# Patient Record
Sex: Female | Born: 1940 | Race: Black or African American | Hispanic: No | Marital: Married | Smoking: Former smoker
Health system: Southern US, Community
[De-identification: ages and names within clinical notes are randomized; demographics above are authoritative.]

## PROBLEM LIST (undated history)

## (undated) DIAGNOSIS — I10 Essential (primary) hypertension: Secondary | ICD-10-CM

## (undated) DIAGNOSIS — E119 Type 2 diabetes mellitus without complications: Secondary | ICD-10-CM

---

## 1999-09-01 HISTORY — PX: REDUCTION MAMMAPLASTY: SUR839

## 2016-01-30 ENCOUNTER — Other Ambulatory Visit: Payer: Self-pay | Admitting: Adult Health

## 2016-01-30 DIAGNOSIS — Z1231 Encounter for screening mammogram for malignant neoplasm of breast: Secondary | ICD-10-CM

## 2016-02-12 ENCOUNTER — Ambulatory Visit
Admission: RE | Admit: 2016-02-12 | Discharge: 2016-02-12 | Disposition: A | Discharge status: Home / Self Care | Payer: Medicare Other | Source: Ambulatory Visit | Attending: Adult Health | Admitting: Adult Health

## 2016-02-12 ENCOUNTER — Other Ambulatory Visit: Payer: Self-pay | Admitting: Adult Health

## 2016-02-12 DIAGNOSIS — Z1231 Encounter for screening mammogram for malignant neoplasm of breast: Secondary | ICD-10-CM | POA: Insufficient documentation

## 2016-04-09 ENCOUNTER — Encounter: Payer: Self-pay | Admitting: *Deleted

## 2016-04-13 ENCOUNTER — Encounter
Admission: RE | Disposition: A | Discharge status: Home / Self Care | Payer: Self-pay | Source: Ambulatory Visit | Attending: Unknown Physician Specialty

## 2016-04-13 ENCOUNTER — Ambulatory Visit: Payer: Medicare Other | Admitting: Anesthesiology

## 2016-04-13 ENCOUNTER — Encounter: Payer: Self-pay | Admitting: Anesthesiology

## 2016-04-13 ENCOUNTER — Ambulatory Visit
Admission: RE | Admit: 2016-04-13 | Discharge: 2016-04-13 | Disposition: A | Discharge status: Home / Self Care | Payer: Medicare Other | Source: Ambulatory Visit | Attending: Unknown Physician Specialty | Admitting: Unknown Physician Specialty

## 2016-04-13 DIAGNOSIS — Z79899 Other long term (current) drug therapy: Secondary | ICD-10-CM | POA: Diagnosis not present

## 2016-04-13 DIAGNOSIS — D125 Benign neoplasm of sigmoid colon: Secondary | ICD-10-CM | POA: Diagnosis not present

## 2016-04-13 DIAGNOSIS — Z87891 Personal history of nicotine dependence: Secondary | ICD-10-CM | POA: Insufficient documentation

## 2016-04-13 DIAGNOSIS — K643 Fourth degree hemorrhoids: Secondary | ICD-10-CM | POA: Diagnosis not present

## 2016-04-13 DIAGNOSIS — Z1211 Encounter for screening for malignant neoplasm of colon: Secondary | ICD-10-CM | POA: Insufficient documentation

## 2016-04-13 DIAGNOSIS — D122 Benign neoplasm of ascending colon: Secondary | ICD-10-CM | POA: Diagnosis not present

## 2016-04-13 DIAGNOSIS — I10 Essential (primary) hypertension: Secondary | ICD-10-CM | POA: Diagnosis not present

## 2016-04-13 HISTORY — PX: COLONOSCOPY: SHX5424

## 2016-04-13 HISTORY — DX: Essential (primary) hypertension: I10

## 2016-04-13 SURGERY — COLONOSCOPY
Anesthesia: General

## 2016-04-13 MED ORDER — PROPOFOL 500 MG/50ML IV EMUL
INTRAVENOUS | Status: DC | PRN
  Administered 2016-04-13: 50 ug/kg/min via INTRAVENOUS

## 2016-04-13 MED ORDER — SODIUM CHLORIDE 0.9 % IV SOLN: INTRAVENOUS | Status: DC

## 2016-04-13 MED ORDER — SODIUM CHLORIDE 0.9 % IV SOLN
INTRAVENOUS | Status: DC
  Administered 2016-04-13: 1000 mL via INTRAVENOUS

## 2016-04-13 MED ORDER — FENTANYL CITRATE (PF) 100 MCG/2ML IJ SOLN: 25.0000 ug | INTRAMUSCULAR | Status: DC | PRN

## 2016-04-13 MED ORDER — MIDAZOLAM HCL 5 MG/5ML IJ SOLN
INTRAMUSCULAR | Status: DC | PRN
  Administered 2016-04-13: 1 mg via INTRAVENOUS

## 2016-04-13 MED ORDER — FENTANYL CITRATE (PF) 100 MCG/2ML IJ SOLN
INTRAMUSCULAR | Status: DC | PRN
  Administered 2016-04-13: 50 ug via INTRAVENOUS

## 2016-04-13 MED ORDER — LIDOCAINE HCL (PF) 2 % IJ SOLN
INTRAMUSCULAR | Status: DC | PRN
  Administered 2016-04-13: 50 mg

## 2016-04-13 MED ORDER — PROPOFOL 10 MG/ML IV BOLUS
INTRAVENOUS | Status: DC | PRN
  Administered 2016-04-13 (×2): 20 mg via INTRAVENOUS
  Administered 2016-04-13: 30 mg via INTRAVENOUS

## 2016-04-13 MED ORDER — ONDANSETRON HCL 4 MG/2ML IJ SOLN: 4.0000 mg | Freq: Once | INTRAMUSCULAR | Status: DC | PRN

## 2016-04-13 NOTE — H&P (Signed)
   Primary Care Physician:  Lavera Guise, PA-C Primary Gastroenterologist:  Dr. Vira Agar  Pre-Procedure History & Physical: HPI:  Sarah Morse is a 75 y.o. female is here for an colonoscopy.   Past Medical History:  Diagnosis Date  . Hypertension     Past Surgical History:  Procedure Laterality Date  . REDUCTION MAMMAPLASTY Bilateral 2001    Prior to Admission medications   Medication Sig Start Date End Date Taking? Authorizing Provider  lisinopril (PRINIVIL,ZESTRIL) 10 MG tablet Take 10 mg by mouth 2 (two) times daily.   Yes Historical Provider, MD    Allergies as of 03/11/2016  . (Not on File)    Family History  Problem Relation Age of Onset  . Breast cancer Neg Hx     Social History   Social History  . Marital status: Married    Spouse name: N/A  . Number of children: N/A  . Years of education: N/A   Occupational History  . Not on file.   Social History Main Topics  . Smoking status: Former Research scientist (life sciences)  . Smokeless tobacco: Never Used  . Alcohol use Yes  . Drug use: No  . Sexual activity: Not on file   Other Topics Concern  . Not on file   Social History Narrative  . No narrative on file    Review of Systems: See HPI, otherwise negative ROS  Physical Exam: BP (!) (P) 118/92   Pulse 64   Temp 97 F (36.1 C) (Tympanic)   Resp 12   Ht 5' (1.524 m)   Wt 58.5 kg (129 lb)   SpO2 100%   BMI 25.19 kg/m  General:   Alert,  pleasant and cooperative in NAD Head:  Normocephalic and atraumatic. Neck:  Supple; no masses or thyromegaly. Lungs:  Clear throughout to auscultation.    Heart:  Regular rate and rhythm. Abdomen:  Soft, nontender and nondistended. Normal bowel sounds, without guarding, and without rebound.   Neurologic:  Alert and  oriented x4;  grossly normal neurologically.  Impression/Plan: Sarah Morse is here for an colonoscopy to be performed for screening  Risks, benefits, limitations, and alternatives regarding  colonoscopy have  been reviewed with the patient.  Questions have been answered.  All parties agreeable.   Gaylyn Cheers, MD  04/13/2016, 8:30 AM

## 2016-04-13 NOTE — Op Note (Signed)
[facility] Patient Name: Sarah Morse Procedure Date: 04/13/2016 7:40 AM MRN: [id number] Account #: [id number] Date of Birth: Nov 10, 1940 Admit Type: Outpatient Age: 75 Room: [facility] ENDO ROOM 1 Gender: Female Note Status: Finalized Procedure:            Colonoscopy Indications:          Screening for colorectal malignant neoplasm Providers:            Manya Silvas, MD Referring MD:         Lavera Guise, NP Medicines:            Propofol per Anesthesia Procedure:            Pre-Anesthesia Assessment:                       - After reviewing the risks and benefits, the patient                        was deemed in satisfactory condition to undergo the                        procedure.                       After obtaining informed consent, the colonoscope was                        passed under direct vision. Throughout the procedure,                        the patient's blood pressure, pulse, and oxygen                        saturations were monitored continuously. The                        Colonoscope was introduced through the anus and                        advanced to the the cecum, identified by appendiceal                        orifice and ileocecal valve. The colonoscopy was                        performed without difficulty. The patient tolerated the                        procedure well. The quality of the bowel preparation                        was good. Findings:      Two sessile polyps were found in the distal sigmoid colon and mid       ascending colon. The polyps were diminutive in size. These polyps were       removed with a jumbo cold forceps. Resection and retrieval were complete.      External and internal hemorrhoids were found. The hemorrhoids were large       and Grade IV (internal hemorrhoids that prolapse and cannot be reduced       manually). Impression:           -  Two diminutive polyps in the distal sigmoid  colon and                        in the mid ascending colon, removed with a jumbo cold                        forceps. Resected and retrieved. - External and                        internal hemorrhoids. Very Large hemorrhoids. Recommendation:       - Await pathology results. Manya Silvas, MD 04/13/2016 8:15:18 AM This report has been signed electronically. Number of Addenda: 0 Note Initiated On: 04/13/2016 7:40 AM Scope Withdrawal Time: 0 hours 11 minutes 0 seconds  Total Procedure Duration: 0 hours 21 minutes 26 seconds       [facility]

## 2016-04-13 NOTE — Anesthesia Postprocedure Evaluation (Signed)
Anesthesia Post Note  Patient: Sarah Morse  Procedure(s) Performed: Procedure(s) (LRB): COLONOSCOPY (N/A)  Patient location during evaluation: PACU Anesthesia Type: General Level of consciousness: awake and alert and oriented Pain management: pain level controlled Vital Signs Assessment: post-procedure vital signs reviewed and stable Respiratory status: spontaneous breathing Cardiovascular status: blood pressure returned to baseline Anesthetic complications: no    Last Vitals:  Vitals:   04/13/16 0830 04/13/16 0840  BP: 133/78 133/85  Pulse: 62 62  Resp:    Temp:      Last Pain:  Vitals:   04/13/16 0840  TempSrc:   PainSc: 3                  Eduar Kumpf

## 2016-04-13 NOTE — Anesthesia Preprocedure Evaluation (Addendum)
Anesthesia Evaluation  Patient identified by MRN, date of birth, ID band Patient awake    Reviewed: Allergy & Precautions, NPO status , Patient's Chart, lab work & pertinent test results  Airway Mallampati: II       Dental  (+) Partial Lower, Partial Upper   Pulmonary neg pulmonary ROS, former smoker,    Pulmonary exam normal        Cardiovascular hypertension, Pt. on medications Normal cardiovascular exam     Neuro/Psych negative neurological ROS  negative psych ROS   GI/Hepatic negative GI ROS, Neg liver ROS,   Endo/Other  negative endocrine ROS  Renal/GU negative Renal ROS  negative genitourinary   Musculoskeletal negative musculoskeletal ROS (+)   Abdominal Normal abdominal exam  (+)   Peds negative pediatric ROS (+)  Hematology negative hematology ROS (+)   Anesthesia Other Findings   Reproductive/Obstetrics                           Anesthesia Physical Anesthesia Plan  ASA: II  Anesthesia Plan: General   Post-op Pain Management:    Induction: Intravenous  Airway Management Planned: Nasal Cannula  Additional Equipment:   Intra-op Plan:   Post-operative Plan:   Informed Consent: I have reviewed the patients History and Physical, chart, labs and discussed the procedure including the risks, benefits and alternatives for the proposed anesthesia with the patient or authorized representative who has indicated his/her understanding and acceptance.   Dental advisory given  Plan Discussed with: CRNA and Surgeon  Anesthesia Plan Comments:         Anesthesia Quick Evaluation

## 2016-04-13 NOTE — Transfer of Care (Signed)
Immediate Anesthesia Transfer of Care Note  Patient: Sarah Morse  Procedure(s) Performed: Procedure(s): COLONOSCOPY (N/A)  Patient Location: PACU  Anesthesia Type:General  Level of Consciousness: sedated  Airway & Oxygen Therapy: Patient Spontanous Breathing and Patient connected to nasal cannula oxygen  Post-op Assessment: Report given to RN and Post -op Vital signs reviewed and stable  Post vital signs: Reviewed and stable  Last Vitals:  Vitals:   04/13/16 0709  BP: (!) 148/71  Pulse: 79  Resp: 16  Temp: (!) 35.6 C    Last Pain: There were no vitals filed for this visit.       Complications: No apparent anesthesia complications

## 2016-04-14 ENCOUNTER — Encounter: Payer: Self-pay | Admitting: Unknown Physician Specialty

## 2016-04-15 LAB — SURGICAL PATHOLOGY

## 2018-03-16 ENCOUNTER — Other Ambulatory Visit: Payer: Self-pay | Admitting: Family Medicine

## 2018-03-16 DIAGNOSIS — Z1231 Encounter for screening mammogram for malignant neoplasm of breast: Secondary | ICD-10-CM

## 2018-07-10 ENCOUNTER — Emergency Department
Admission: EM | Admit: 2018-07-10 | Discharge: 2018-07-10 | Disposition: A | Discharge status: Home / Self Care | Payer: Medicare Other | Attending: Emergency Medicine | Admitting: Emergency Medicine

## 2018-07-10 ENCOUNTER — Encounter: Payer: Self-pay | Admitting: Emergency Medicine

## 2018-07-10 ENCOUNTER — Other Ambulatory Visit: Payer: Self-pay

## 2018-07-10 DIAGNOSIS — Z79899 Other long term (current) drug therapy: Secondary | ICD-10-CM | POA: Diagnosis not present

## 2018-07-10 DIAGNOSIS — Z87891 Personal history of nicotine dependence: Secondary | ICD-10-CM | POA: Insufficient documentation

## 2018-07-10 DIAGNOSIS — I10 Essential (primary) hypertension: Secondary | ICD-10-CM | POA: Insufficient documentation

## 2018-07-10 NOTE — ED Provider Notes (Signed)
[facility] Emergency Department Provider Note  ____________________________________________   I have reviewed the triage vital signs and the nursing notes. Where available I have reviewed prior notes and, if possible and indicated, outside hospital notes.    HISTORY  Chief Complaint Hypertension    HPI Sarah Morse is a 77 y.o. female, delightful, presents today stating that her blood pressures been elevated today.  She states that she has been under a lot of stress recently because her husband has been sick and usually this is the day she takes to relax and think about herself.  She states she was in her normal state of health checked her blood pressure seemed a little bit high this morning, 150, and she became anxious about that rechecked her blood pressure 5 or 6 times and each time she checked it it went up.  She then became concerned that something was wrong, she did a survey of her body and determined that she had a very slight headache.  She states "a nothing headache" and this made her more anxious and then she came in.  Now, on quite reflection, she feels that this is all because she was anxious about her blood pressure.  She denies any stiff neck fever chills numbness weakness focal deficit.  She states she wants to get out of here so that she does not have to drive home in the pitch black.  She states that she wanted to make sure her blood pressure was not can continue to go up.  Blood pressure this time is in the 150s after she relaxes in the bed for a few minutes, she is very satisfied with this and would prefer not to have any further intervention.  She states her headache is completely gone. She did take her blood pressure medication has not had no recent change in her blood pressure, she does drink coffee every day, she states that she might have had extra salt yesterday which usually she is "sensitive to" and can elevate her blood pressure somewhat.  She  denies any neurologic deficit.  Past Medical History:  Diagnosis Date  . Hypertension     There are no active problems to display for this patient.   Past Surgical History:  Procedure Laterality Date  . COLONOSCOPY N/A 04/13/2016   Procedure: COLONOSCOPY;  Surgeon: Manya Silvas, MD;  Location: [facility] ENDOSCOPY;  Service: Endoscopy;  Laterality: N/A;  . REDUCTION MAMMAPLASTY Bilateral 2001    Prior to Admission medications   Medication Sig Start Date End Date Taking? Authorizing Provider  lisinopril (PRINIVIL,ZESTRIL) 10 MG tablet Take 10 mg by mouth 2 (two) times daily.    [provider]    Allergies Patient has no known allergies.  Family History  Problem Relation Age of Onset  . Breast cancer Neg Hx     Social History Social History   Tobacco Use  . Smoking status: Former Research scientist (life sciences)  . Smokeless tobacco: Never Used  Substance Use Topics  . Alcohol use: Yes  . Drug use: No    Review of Systems Constitutional: No fever/chills Eyes: No visual changes. ENT: No sore throat. No stiff neck no neck pain Cardiovascular: Denies chest pain. Respiratory: Denies shortness of breath. Gastrointestinal:   no vomiting.  No diarrhea.  No constipation. Genitourinary: Negative for dysuria. Musculoskeletal: Negative lower extremity swelling Skin: Negative for rash. Neurological: Negative for severe headaches, focal weakness or numbness.   ____________________________________________   PHYSICAL EXAM:  VITAL SIGNS: ED Triage Vitals  Enc Vitals Group     BP 07/10/18 1808 (!) 180/71     Pulse Rate 07/10/18 1808 75     Resp 07/10/18 1808 16     Temp 07/10/18 1808 98.4 F (36.9 C)     Temp Source 07/10/18 1808 Oral     SpO2 07/10/18 1808 100 %     Weight 07/10/18 1809 125 lb (56.7 kg)     Height --      Head Circumference --      Peak Flow --      Pain Score 07/10/18 1809 0     Pain Loc --      Pain Edu? --      Excl. in [place]? --     Constitutional: Alert  and oriented. Well appearing and in no acute distress. Eyes: Conjunctivae are normal Head: Atraumatic HEENT: No congestion/rhinnorhea. Mucous membranes are moist.  Oropharynx non-erythematous Neck:   Nontender with no meningismus, no masses, no stridor Cardiovascular: Normal rate, regular rhythm. Grossly normal heart sounds.  Good peripheral circulation. Respiratory: Normal respiratory effort.  No retractions. Lungs CTAB. Abdominal: Soft and nontender. No distention. No guarding no rebound Back:  There is no focal tenderness or step off.  there is no midline tenderness there are no lesions noted. there is no CVA tenderness Musculoskeletal: No lower extremity tenderness, no upper extremity tenderness. No joint effusions, no DVT signs strong distal pulses no edema Neurologic:  cranial nerves II through XII are grossly intact 5 out of 5 strength bilateral upper and lower extremity. Finger to nose within normal limits heel to shin within normal limits, speech is normal with no word finding difficulty or dysarthria, reflexes symmetric, pupils are equally round and reactive to light, there is no pronator drift, sensation is normal, vision is intact to confrontation, gait is deferred, there is no nystagmus, normal neurologic exam.  Skin:  Skin is warm, dry and intact. No rash noted. Psychiatric: Mood and affect are somewhat anxious. Speech and behavior are normal.  ____________________________________________   LABS (all labs ordered are listed, but only abnormal results are displayed)  Labs Reviewed - No data to display  Pertinent labs  results that were available during my care of the patient were reviewed by me and considered in my medical decision making (see chart for details). ____________________________________________  EKG  I personally interpreted any EKGs ordered by me or triage  ____________________________________________  RADIOLOGY  Pertinent labs & imaging results that were  available during my care of the patient were reviewed by me and considered in my medical decision making (see chart for details). If possible, patient and/or family made aware of any abnormal findings.  No results found. ____________________________________________    PROCEDURES  Procedure(s) performed: None  Procedures  Critical Care performed: None  ____________________________________________   INITIAL IMPRESSION / ASSESSMENT AND PLAN / ED COURSE  Pertinent labs & imaging results that were available during my care of the patient were reviewed by me and considered in my medical decision making (see chart for details).  Patient here today because she checked her blood pressure and every time she checked that she became more anxious and every time she became more anxious her blood pressure one father up in the cycle continue to she came to the emergency room.  She is now calm and relaxed and her blood pressure is back down to its baseline she has no complaints, I explained to her that her slight headache could represent some significant pathology but  I have very low suspicion.  I offered her a CT scan of her head but she is adamant that she would like to go home at this time because it is getting dark and she has to drive herself and her husband home she is the primary caretaker of her husband who is poor health.  Accordingly, she declines CT scan which I do not think is unreasonable.  I do not think is any acute indication for imaging blood work or other work-up at this moment given how her exam and her vitals are at this time.  I think patient is capable of making the decision and she understands limitation of the work-up as it is however.  Extensive return precautions follow-up given and understood patient is requesting discharge paperwork at this time we will discharge her, I have advised her not to take her blood pressure 1 twice a day and to call her doctor on Monday.     ____________________________________________   FINAL CLINICAL IMPRESSION(S) / ED DIAGNOSES  Final diagnoses:  None      This chart was dictated using voice recognition software.  Despite best efforts to proofread,  errors can occur which can change meaning.      Schuyler Amor, MD 07/10/18 1945

## 2018-07-10 NOTE — Discharge Instructions (Addendum)
We advised that you check your blood pressure no more than twice a day, you may keep a log of it, and let your doctor know and they may decide to change your blood pressure medications.  You prefer not to stay for CT scan today which is certainly not unreasonable, but it does limit what we can evaluate you for.  We are however reassured by your exam and your blood pressure here and we asked that you return to the emergency room for any new or worrisome symptoms including numbness weakness chest pain severe headache or other concerns.  We do advise that you continue taking her blood pressure medications as prescribed, and again discussed with your blood pressure physician.  It was a pleasure to meet you please drive safely

## 2018-07-10 NOTE — ED Triage Notes (Signed)
Pt to ED via POV c/o HTN. Pt states that she has a hx/o High Blood Pressure, pt states that she takes medication to manage her HBP but today her blood pressure has been up and down. Pt states that she has had slight headache. Pt is in NAD

## 2019-03-16 ENCOUNTER — Other Ambulatory Visit: Payer: Self-pay | Admitting: Student

## 2019-03-16 DIAGNOSIS — R1010 Upper abdominal pain, unspecified: Secondary | ICD-10-CM

## 2019-03-23 ENCOUNTER — Ambulatory Visit
Admission: RE | Admit: 2019-03-23 | Discharge: 2019-03-23 | Disposition: A | Discharge status: Home / Self Care | Payer: Medicare Other | Source: Ambulatory Visit | Attending: Student | Admitting: Student

## 2019-03-23 ENCOUNTER — Other Ambulatory Visit: Payer: Self-pay

## 2019-03-23 DIAGNOSIS — R1010 Upper abdominal pain, unspecified: Secondary | ICD-10-CM | POA: Insufficient documentation

## 2019-03-31 ENCOUNTER — Other Ambulatory Visit: Payer: Self-pay | Admitting: Student

## 2019-03-31 DIAGNOSIS — K5909 Other constipation: Secondary | ICD-10-CM

## 2019-03-31 DIAGNOSIS — R634 Abnormal weight loss: Secondary | ICD-10-CM

## 2019-03-31 DIAGNOSIS — R1084 Generalized abdominal pain: Secondary | ICD-10-CM

## 2019-04-06 ENCOUNTER — Ambulatory Visit
Admission: RE | Admit: 2019-04-06 | Discharge: 2019-04-06 | Disposition: A | Discharge status: Home / Self Care | Payer: Medicare Other | Source: Ambulatory Visit | Attending: Student | Admitting: Student

## 2019-04-06 ENCOUNTER — Other Ambulatory Visit: Payer: Self-pay

## 2019-04-06 DIAGNOSIS — R1084 Generalized abdominal pain: Secondary | ICD-10-CM | POA: Insufficient documentation

## 2019-04-06 DIAGNOSIS — R634 Abnormal weight loss: Secondary | ICD-10-CM | POA: Diagnosis present

## 2019-04-06 DIAGNOSIS — K5909 Other constipation: Secondary | ICD-10-CM | POA: Insufficient documentation

## 2019-04-06 HISTORY — DX: Type 2 diabetes mellitus without complications: E11.9

## 2019-04-06 LAB — POCT I-STAT CREATININE: Creatinine, Ser: 0.8 mg/dL (ref 0.44–1.00)

## 2019-04-06 MED ORDER — IOHEXOL 300 MG/ML  SOLN
75.0000 mL | Freq: Once | INTRAMUSCULAR | Status: AC | PRN
  Administered 2019-04-06: 75 mL via INTRAVENOUS

## 2019-06-15 ENCOUNTER — Other Ambulatory Visit: Payer: Self-pay | Admitting: Family Medicine

## 2019-06-15 DIAGNOSIS — Z1231 Encounter for screening mammogram for malignant neoplasm of breast: Secondary | ICD-10-CM

## 2019-10-05 ENCOUNTER — Ambulatory Visit
Admission: RE | Admit: 2019-10-05 | Discharge: 2019-10-05 | Disposition: A | Discharge status: Home / Self Care | Payer: Medicare Other | Source: Ambulatory Visit | Attending: Family Medicine | Admitting: Family Medicine

## 2019-10-05 DIAGNOSIS — Z1231 Encounter for screening mammogram for malignant neoplasm of breast: Secondary | ICD-10-CM | POA: Insufficient documentation

## 2019-11-30 ENCOUNTER — Ambulatory Visit: Payer: TRICARE For Life (TFL) | Attending: Internal Medicine

## 2019-11-30 DIAGNOSIS — Z23 Encounter for immunization: Secondary | ICD-10-CM

## 2019-11-30 NOTE — Progress Notes (Signed)
   Covid-19 Vaccination Clinic  Name:  Sarah Morse    MRN: [id number] DOB: 09/02/1940  11/30/2019  Sarah Morse was observed post Covid-19 immunization for 15 minutes without incident. She was provided with Vaccine Information Sheet and instruction to access the V-Safe system.   Sarah Morse was instructed to call 911 with any severe reactions post vaccine: Marland Kitchen Difficulty breathing  . Swelling of face and throat  . A fast heartbeat  . A bad rash all over body  . Dizziness and weakness   Immunizations Administered    Name Date Dose VIS Date Route   Pfizer COVID-19 Vaccine 11/30/2019 10:05 AM 0.3 mL 08/11/2019 Intramuscular   Manufacturer: [place]   Lot: [id number]   [place]: [id number]

## 2019-12-26 ENCOUNTER — Ambulatory Visit: Payer: Medicare Other | Attending: Internal Medicine

## 2019-12-26 DIAGNOSIS — Z23 Encounter for immunization: Secondary | ICD-10-CM

## 2019-12-26 NOTE — Progress Notes (Signed)
   Covid-19 Vaccination Clinic  Name:  Sarah Morse    MRN: [id number] DOB: February 11, 1941  12/26/2019  [name] was observed post Covid-19 immunization for 15 minutes without incident. She was provided with Vaccine Information Sheet and instruction to access the V-Safe system.   [name] was instructed to call 911 with any severe reactions post vaccine: Marland Kitchen Difficulty breathing  . Swelling of face and throat  . A fast heartbeat  . A bad rash all over body  . Dizziness and weakness   Immunizations Administered    Name Date Dose VIS Date Route   Pfizer COVID-19 Vaccine 12/26/2019 10:03 AM 0.3 mL 10/25/2018 Intramuscular   Manufacturer: [employer]   Lot: [id number]   [place]: [id number]

## 2020-04-15 ENCOUNTER — Other Ambulatory Visit: Payer: Self-pay

## 2020-04-15 ENCOUNTER — Other Ambulatory Visit: Payer: Medicare Other

## 2020-04-15 DIAGNOSIS — Z20822 Contact with and (suspected) exposure to covid-19: Secondary | ICD-10-CM

## 2020-04-16 LAB — NOVEL CORONAVIRUS, NAA: SARS-CoV-2, NAA: NOT DETECTED

## 2020-04-16 LAB — SARS-COV-2, NAA 2 DAY TAT

## 2020-07-30 ENCOUNTER — Ambulatory Visit: Payer: Self-pay

## 2020-08-10 ENCOUNTER — Ambulatory Visit: Payer: Medicare Other | Attending: Internal Medicine

## 2020-08-10 DIAGNOSIS — Z23 Encounter for immunization: Secondary | ICD-10-CM

## 2020-08-10 NOTE — Progress Notes (Signed)
   Covid-19 Vaccination Clinic  Name:  Sarah Morse    MRN: [id number] DOB: Apr 11, 1941  08/10/2020  [name] was observed post Covid-19 immunization for 15 minutes without incident. She was provided with Vaccine Information Sheet and instruction to access the V-Safe system.   [name] was instructed to call 911 with any severe reactions post vaccine: Marland Kitchen Difficulty breathing  . Swelling of face and throat  . A fast heartbeat  . A bad rash all over body  . Dizziness and weakness   Immunizations Administered    Name Date Dose VIS Date [place] COVID-19 Vaccine 08/10/2020  9:50 AM 0.3 mL 06/19/2020 Intramuscular   Manufacturer: [place]   Lot: [id number]   [place]: [id number]

## 2020-10-23 ENCOUNTER — Other Ambulatory Visit: Payer: Self-pay | Admitting: Family Medicine

## 2020-10-23 DIAGNOSIS — Z1231 Encounter for screening mammogram for malignant neoplasm of breast: Secondary | ICD-10-CM

## 2020-11-07 ENCOUNTER — Ambulatory Visit
Admission: RE | Admit: 2020-11-07 | Discharge: 2020-11-07 | Disposition: A | Discharge status: Home / Self Care | Payer: Medicare Other | Source: Ambulatory Visit | Attending: Family Medicine | Admitting: Family Medicine

## 2020-11-07 ENCOUNTER — Other Ambulatory Visit: Payer: Self-pay

## 2020-11-07 DIAGNOSIS — Z1231 Encounter for screening mammogram for malignant neoplasm of breast: Secondary | ICD-10-CM | POA: Insufficient documentation

## 2021-07-03 ENCOUNTER — Other Ambulatory Visit: Payer: Self-pay

## 2021-07-03 ENCOUNTER — Ambulatory Visit: Payer: Medicare Other | Attending: Internal Medicine

## 2021-07-03 DIAGNOSIS — Z23 Encounter for immunization: Secondary | ICD-10-CM

## 2021-07-03 MED ORDER — PFIZER COVID-19 VAC BIVALENT 30 MCG/0.3ML IM SUSP
INTRAMUSCULAR | 0 refills | Status: AC
  Filled 2021-07-03: qty 0.3, 1d supply, fill #0

## 2021-07-03 NOTE — Progress Notes (Signed)
   Covid-19 Vaccination Clinic  Name:  Sarah Morse    MRN: [id number] DOB: April 10, 1941  07/03/2021  [name] was observed post Covid-19 immunization for 15 minutes without incident. She was provided with Vaccine Information Sheet and instruction to access the V-Safe system.   [name] was instructed to call 911 with any severe reactions post vaccine: Difficulty breathing  Swelling of face and throat  A fast heartbeat  A bad rash all over body  Dizziness and weakness   Immunizations Administered     Name Date Dose VIS Date Route   Pfizer Covid-19 Vaccine Bivalent Booster 07/03/2021 11:20 AM 0.3 mL 04/30/2021 Intramuscular   Manufacturer: [place]   Lot: [id number]   [place]: [phone]

## 2021-10-20 ENCOUNTER — Other Ambulatory Visit: Payer: Self-pay | Admitting: Family Medicine

## 2021-10-20 DIAGNOSIS — Z1231 Encounter for screening mammogram for malignant neoplasm of breast: Secondary | ICD-10-CM

## 2021-10-23 ENCOUNTER — Emergency Department
Admission: EM | Admit: 2021-10-23 | Discharge: 2021-10-23 | Disposition: A | Discharge status: Home / Self Care | Payer: Medicare Other | Attending: Emergency Medicine | Admitting: Emergency Medicine

## 2021-10-23 ENCOUNTER — Emergency Department: Payer: Medicare Other

## 2021-10-23 ENCOUNTER — Other Ambulatory Visit: Payer: Self-pay

## 2021-10-23 DIAGNOSIS — J069 Acute upper respiratory infection, unspecified: Secondary | ICD-10-CM | POA: Insufficient documentation

## 2021-10-23 DIAGNOSIS — R059 Cough, unspecified: Secondary | ICD-10-CM | POA: Diagnosis present

## 2021-10-23 DIAGNOSIS — R0789 Other chest pain: Secondary | ICD-10-CM

## 2021-10-23 DIAGNOSIS — Z79899 Other long term (current) drug therapy: Secondary | ICD-10-CM | POA: Diagnosis not present

## 2021-10-23 DIAGNOSIS — E119 Type 2 diabetes mellitus without complications: Secondary | ICD-10-CM | POA: Insufficient documentation

## 2021-10-23 DIAGNOSIS — Z20822 Contact with and (suspected) exposure to covid-19: Secondary | ICD-10-CM | POA: Insufficient documentation

## 2021-10-23 DIAGNOSIS — I10 Essential (primary) hypertension: Secondary | ICD-10-CM | POA: Diagnosis not present

## 2021-10-23 LAB — BASIC METABOLIC PANEL
Anion gap: 8 (ref 5–15)
BUN: 16 mg/dL (ref 8–23)
CO2: 26 mmol/L (ref 22–32)
Calcium: 9.4 mg/dL (ref 8.9–10.3)
Chloride: 103 mmol/L (ref 98–111)
Creatinine, Ser: 0.78 mg/dL (ref 0.44–1.00)
GFR, Estimated: >60 mL/min (ref >60)
Glucose, Bld: 145 mg/dL — ABNORMAL HIGH (ref 70–99)
Potassium: 3.7 mmol/L (ref 3.5–5.1)
Sodium: 137 mmol/L (ref 135–145)

## 2021-10-23 LAB — CBC
HCT: 36.2 % (ref 36.0–46.0)
Hemoglobin: 11.6 g/dL — ABNORMAL LOW (ref 12.0–15.0)
MCH: 26.5 pg (ref 26.0–34.0)
MCHC: 32 g/dL (ref 30.0–36.0)
MCV: 82.8 fL (ref 80.0–100.0)
Platelets: 210 10*3/uL (ref 150–400)
RBC: 4.37 MIL/uL (ref 3.87–5.11)
RDW: 13.9 % (ref 11.5–15.5)
WBC: 7 10*3/uL (ref 4.0–10.5)
nRBC: 0 % (ref 0.0–0.2)

## 2021-10-23 LAB — RESP PANEL BY RT-PCR (FLU A&B, COVID) ARPGX2
Influenza A by PCR: NEGATIVE
Influenza B by PCR: NEGATIVE
SARS Coronavirus 2 by RT PCR: NEGATIVE

## 2021-10-23 LAB — TROPONIN I (HIGH SENSITIVITY)
Troponin I (High Sensitivity): 6 ng/L (ref <18)
Troponin I (High Sensitivity): 6 ng/L (ref <18)

## 2021-10-23 NOTE — ED Provider Notes (Signed)
[facility] Provider Note    Event Date/Time   First MD Initiated Contact with Patient 10/23/21 [phone]     (approximate)   History   Cough and Chest Pain   HPI  Sarah Morse is a 81 y.o. female with history of hypertension, diabetes who presents to the emergency department with complaints of several days of sore throat, cough, congestion and then started having diffuse chest tightness that has now resolved.  No shortness of breath, nausea, vomiting, diaphoresis or dizziness.  No lower extremity swelling or pain.  No history of CAD, PE or DVT, CHF.  States granddaughter with similar URI symptoms.   History provided by patient.    Past Medical History:  Diagnosis Date   Diabetes mellitus without complication ([place])    Hypertension     Past Surgical History:  Procedure Laterality Date   COLONOSCOPY N/A 04/13/2016   Procedure: COLONOSCOPY;  Surgeon: Manya Silvas, MD;  Location: [place];  Service: Endoscopy;  Laterality: N/A;   REDUCTION MAMMAPLASTY Bilateral 2001    MEDICATIONS:  Prior to Admission medications   Medication Sig Start Date End Date Taking? Authorizing Provider  COVID-19 mRNA bivalent vaccine, Pfizer, (PFIZER COVID-19 VAC BIVALENT) injection Inject into the muscle. 07/03/21   Carlyle Basques, MD  lisinopril (PRINIVIL,ZESTRIL) 10 MG tablet Take 10 mg by mouth 2 (two) times daily.    [provider]    Physical Exam   Triage Vital Signs: ED Triage Vitals  Enc Vitals Group     BP 10/23/21 0136 (!) 177/72     Pulse Rate 10/23/21 0136 87     Resp 10/23/21 0136 18     Temp 10/23/21 0136 99.1 F (37.3 C)     Temp Source 10/23/21 0136 Oral     SpO2 10/23/21 0136 97 %     Weight 10/23/21 0137 150 lb (68 kg)     Height 10/23/21 0137 4\' 11"  (1.499 m)     Head Circumference --      Peak Flow --      Pain Score 10/23/21 0137 4     Pain Loc --      Pain Edu? --      Excl. in [place]? --     Most recent vital  signs: Vitals:   10/23/21 0136 10/23/21 0430  BP: (!) 177/72 129/61  Pulse: 87 71  Resp: 18   Temp: 99.1 F (37.3 C)   SpO2: 97% 100%    CONSTITUTIONAL: Alert and oriented and responds appropriately to questions. Well-appearing; well-nourished HEAD: Normocephalic, atraumatic EYES: Conjunctivae clear, pupils appear equal, sclera nonicteric ENT: normal nose; moist mucous membranes; No pharyngeal erythema or petechiae, no tonsillar hypertrophy or exudate, no uvular deviation, no unilateral swelling in posterior oropharynx, no trismus or drooling, no muffled voice, normal phonation, no stridor, airway patent. NECK: Supple, normal ROM CARD: RRR; S1 and S2 appreciated; no murmurs, no clicks, no rubs, no gallops RESP: Normal chest excursion without splinting or tachypnea; breath sounds clear and equal bilaterally; no wheezes, no rhonchi, no rales, no hypoxia or respiratory distress, speaking full sentences ABD/GI: Normal bowel sounds; non-distended; soft, non-tender, no rebound, no guarding, no peritoneal signs BACK: The back appears normal EXT: Normal ROM in all joints; no deformity noted, no edema; no cyanosis, no calf tenderness or calf swelling SKIN: Normal color for age and race; warm; no rash on exposed skin NEURO: Moves all extremities equally, normal speech PSYCH: The patient's mood and manner are appropriate.  ED Results / Procedures / Treatments   LABS: (all labs ordered are listed, but only abnormal results are displayed) Labs Reviewed  BASIC METABOLIC PANEL - Abnormal; Notable for the following components:      Result Value   Glucose, Bld 145 (*)    All other components within normal limits  CBC - Abnormal; Notable for the following components:   Hemoglobin 11.6 (*)    All other components within normal limits  RESP PANEL BY RT-PCR (FLU A&B, COVID) ARPGX2  TROPONIN I (HIGH SENSITIVITY)  TROPONIN I (HIGH SENSITIVITY)     EKG:  EKG  Interpretation  Date/Time:  Thursday October 23 2021 01:39:24 EST Ventricular Rate:  79 PR Interval:  150 QRS Duration: 76 QT Interval:  360 QTC Calculation: 412 R Axis:   63 Text Interpretation: Normal sinus rhythm Cannot rule out Anterior infarct , age undetermined Abnormal ECG No previous ECGs available Confirmed by Pryor Curia [phone]) on 10/23/2021 6:05:26 AM         RADIOLOGY: My personal review and interpretation of imaging: Chest x-ray clear.  I have personally reviewed all radiology reports.   DG Chest 2 View  Result Date: 10/23/2021 CLINICAL DATA:  Cough x4 days. EXAM: CHEST - 2 VIEW COMPARISON:  None. FINDINGS: The heart size and mediastinal contours are within normal limits. There is mild calcification of the aortic arch. Both lungs are clear. Mild breast soft tissue artifact is seen overlying the bilateral lung bases. The visualized skeletal structures are unremarkable. IMPRESSION: No active cardiopulmonary disease. Electronically Signed   By: Virgina Norfolk M.D.   On: 10/23/2021 02:04     PROCEDURES:  Critical Care performed: No   CRITICAL CARE Performed by: Cyril Mourning Delisia Mcquiston   Total critical care time: 0 minutes  Critical care time was exclusive of separately billable procedures and treating other patients.  Critical care was necessary to treat or prevent imminent or life-threatening deterioration.  Critical care was time spent personally by me on the following activities: development of treatment plan with patient and/or surrogate as well as nursing, discussions with consultants, evaluation of patient's response to treatment, examination of patient, obtaining history from patient or surrogate, ordering and performing treatments and interventions, ordering and review of laboratory studies, ordering and review of radiographic studies, pulse oximetry and re-evaluation of patient's condition.   Marland Kitchen1-3 Lead EKG Interpretation Performed by: Nicolle Heward, Delice Bison,  DO Authorized by: Zaleah Ternes, Delice Bison, DO     Interpretation: normal     ECG rate:  71   ECG rate assessment: normal     Rhythm: sinus rhythm     Ectopy: none     Conduction: normal      IMPRESSION / MDM / ASSESSMENT AND PLAN / ED COURSE  I reviewed the triage vital signs and the nursing notes.    Patient with atypical chest pain with URI symptoms.  The patient is on the cardiac monitor to evaluate for evidence of arrhythmia and/or significant heart rate changes.   DIFFERENTIAL DIAGNOSIS (includes but not limited to):   Viral URI, influenza, COVID-19, pneumonia.  Less likely ACS, PE, dissection, pneumothorax, CHF   PLAN: We will obtain CBC, BMP, troponin x2, EKG, chest x-ray, COVID and flu swabs.  She declines any medication for discomfort at this time.   MEDICATIONS GIVEN IN ED: Medications - No data to display   ED COURSE: Patient has no leukocytosis or leukopenia.  Normal electrolytes and renal function.  Troponin x2 negative.  EKG nonischemic.  COVID  and flu swabs negative.  Chest x-ray reviewed by myself and radiologist and shows no infiltrate, edema or pneumothorax.  She remains hemodynamically stable and reports her chest tightness has resolved.  No events seen on cardiac monitoring.  I recommended over-the-counter Tylenol as needed for fever and pain at home.  I do not feel at this time she needs antibiotics.  No increased work of breathing, respiratory distress.  I feel she is safe for discharge.  At this time, I do not feel there is any life-threatening condition present. I reviewed all nursing notes, vitals, pertinent previous records.  All lab and urine results, EKGs, imaging ordered have been independently reviewed and interpreted by myself.  I reviewed all available radiology reports from any imaging ordered this visit.  Based on my assessment, I feel the patient is safe to be discharged home without further emergent workup and can continue workup as an outpatient as  needed. Discussed all findings, treatment plan as well as usual and customary return precautions with patient.  They verbalize understanding and are comfortable with this plan.  Outpatient follow-up has been provided as needed.  All questions have been answered.    CONSULTS: Admission considered given patient has risk factors for ACS including hypertension, diabetes and age but given her reassuring work-up and that she is now asymptomatic and seems like symptoms are more likely due to a viral URI, I feel she is safe for discharge.   OUTSIDE RECORDS REVIEWED: Reviewed patient's most recent office visit with primary care Dr. Maryland Pink on 09/23/2021.         FINAL CLINICAL IMPRESSION(S) / ED DIAGNOSES   Final diagnoses:  Viral URI with cough  Atypical chest pain     Rx / DC Orders   ED Discharge Orders     None        Note:  This document was prepared using Dragon voice recognition software and may include unintentional dictation errors.   Teira Arcilla, Delice Bison, DO 10/23/21 [phone]

## 2021-10-23 NOTE — ED Triage Notes (Signed)
Pt presents via POV with complaints of a cough for the last 4 days that has progressively gotten worse with an associated sore throat. Pt states that she has "chest spasms" - starting this evening, describing the spasm as a dull pain. Denies SOB.

## 2021-10-23 NOTE — Discharge Instructions (Signed)
You may take over-the-counter Tylenol 1000 mg every 6 hours as needed for pain.  Your cardiac work-up today was reassuring.  Your chest x-ray was clear.  Your COVID and flu swabs were negative.

## 2021-11-27 ENCOUNTER — Ambulatory Visit
Admission: RE | Admit: 2021-11-27 | Discharge: 2021-11-27 | Disposition: A | Discharge status: Home / Self Care | Payer: Medicare Other | Source: Ambulatory Visit | Attending: Family Medicine | Admitting: Family Medicine

## 2021-11-27 DIAGNOSIS — Z1231 Encounter for screening mammogram for malignant neoplasm of breast: Secondary | ICD-10-CM | POA: Insufficient documentation

## 2022-02-12 ENCOUNTER — Other Ambulatory Visit: Payer: Self-pay | Admitting: Sports Medicine

## 2022-02-12 DIAGNOSIS — G8929 Other chronic pain: Secondary | ICD-10-CM

## 2022-02-12 DIAGNOSIS — M5136 Other intervertebral disc degeneration, lumbar region: Secondary | ICD-10-CM

## 2022-03-05 ENCOUNTER — Ambulatory Visit
Admission: RE | Admit: 2022-03-05 | Discharge: 2022-03-05 | Disposition: A | Discharge status: Home / Self Care | Payer: Medicare Other | Source: Ambulatory Visit | Attending: Sports Medicine | Admitting: Sports Medicine

## 2022-03-05 DIAGNOSIS — G8929 Other chronic pain: Secondary | ICD-10-CM | POA: Insufficient documentation

## 2022-03-05 DIAGNOSIS — M5442 Lumbago with sciatica, left side: Secondary | ICD-10-CM | POA: Diagnosis present

## 2022-03-05 DIAGNOSIS — M5441 Lumbago with sciatica, right side: Secondary | ICD-10-CM | POA: Insufficient documentation

## 2022-03-05 DIAGNOSIS — M5136 Other intervertebral disc degeneration, lumbar region: Secondary | ICD-10-CM | POA: Diagnosis present

## 2022-08-04 ENCOUNTER — Ambulatory Visit: Payer: Medicare Other | Admitting: Certified Registered Nurse Anesthetist

## 2022-08-04 ENCOUNTER — Encounter: Payer: Self-pay | Admitting: *Deleted

## 2022-08-04 ENCOUNTER — Encounter
Admission: RE | Disposition: A | Discharge status: Home / Self Care | Payer: Self-pay | Source: Ambulatory Visit | Attending: Gastroenterology

## 2022-08-04 ENCOUNTER — Ambulatory Visit
Admission: RE | Admit: 2022-08-04 | Discharge: 2022-08-04 | Disposition: A | Discharge status: Home / Self Care | Payer: Medicare Other | Source: Ambulatory Visit | Attending: Gastroenterology | Admitting: Gastroenterology

## 2022-08-04 DIAGNOSIS — Z1211 Encounter for screening for malignant neoplasm of colon: Secondary | ICD-10-CM | POA: Insufficient documentation

## 2022-08-04 DIAGNOSIS — Z86010 Personal history of colonic polyps: Secondary | ICD-10-CM | POA: Insufficient documentation

## 2022-08-04 DIAGNOSIS — E119 Type 2 diabetes mellitus without complications: Secondary | ICD-10-CM | POA: Diagnosis not present

## 2022-08-04 DIAGNOSIS — I10 Essential (primary) hypertension: Secondary | ICD-10-CM | POA: Diagnosis not present

## 2022-08-04 DIAGNOSIS — Z87891 Personal history of nicotine dependence: Secondary | ICD-10-CM | POA: Diagnosis not present

## 2022-08-04 DIAGNOSIS — K642 Third degree hemorrhoids: Secondary | ICD-10-CM | POA: Diagnosis not present

## 2022-08-04 DIAGNOSIS — K5730 Diverticulosis of large intestine without perforation or abscess without bleeding: Secondary | ICD-10-CM | POA: Insufficient documentation

## 2022-08-04 HISTORY — PX: COLONOSCOPY WITH PROPOFOL: SHX5780

## 2022-08-04 SURGERY — COLONOSCOPY WITH PROPOFOL
Anesthesia: General

## 2022-08-04 MED ORDER — PROPOFOL 500 MG/50ML IV EMUL
INTRAVENOUS | Status: DC | PRN
  Administered 2022-08-04: 150 ug/kg/min via INTRAVENOUS

## 2022-08-04 MED ORDER — EPHEDRINE SULFATE (PRESSORS) 50 MG/ML IJ SOLN
INTRAMUSCULAR | Status: DC | PRN
  Administered 2022-08-04: 5 mg via INTRAVENOUS

## 2022-08-04 MED ORDER — SODIUM CHLORIDE 0.9 % IV SOLN
INTRAVENOUS | Status: DC
  Administered 2022-08-04: 1000 mL via INTRAVENOUS

## 2022-08-04 MED ORDER — PROPOFOL 10 MG/ML IV BOLUS
INTRAVENOUS | Status: DC | PRN
  Administered 2022-08-04: 50 mg via INTRAVENOUS
  Administered 2022-08-04: 20 mg via INTRAVENOUS

## 2022-08-04 MED ORDER — LIDOCAINE HCL (CARDIAC) PF 100 MG/5ML IV SOSY
PREFILLED_SYRINGE | INTRAVENOUS | Status: DC | PRN
  Administered 2022-08-04: 50 mg via INTRAVENOUS

## 2022-08-04 MED ORDER — EPHEDRINE 5 MG/ML INJ
INTRAVENOUS | Status: AC
  Filled 2022-08-04: qty 5

## 2022-08-04 NOTE — Anesthesia Procedure Notes (Signed)
Date/Time: 08/04/2022 10:07 AM  Performed by: Johnna Acosta, CRNAPre-anesthesia Checklist: Patient identified, Emergency Drugs available, Suction available, Patient being monitored and Timeout performed Patient Re-evaluated:Patient Re-evaluated prior to induction Oxygen Delivery Method: Nasal cannula Preoxygenation: Pre-oxygenation with 100% oxygen Induction Type: IV induction

## 2022-08-04 NOTE — Anesthesia Preprocedure Evaluation (Signed)
Anesthesia Evaluation  Patient identified by MRN, date of birth, ID band Patient awake    Reviewed: Allergy & Precautions, NPO status , Patient's Chart, lab work & pertinent test results  History of Anesthesia Complications Negative for: history of anesthetic complications  Airway Mallampati: II       Dental  (+) Partial Lower, Partial Upper, Dental Advidsory Given   Pulmonary neg pulmonary ROS, former smoker   Pulmonary exam normal        Cardiovascular Exercise Tolerance: Good hypertension, Pt. on medications (-) angina (-) Past MI and (-) Cardiac Stents Normal cardiovascular exam(-) dysrhythmias (-) Valvular Problems/Murmurs     Neuro/Psych negative neurological ROS  negative psych ROS   GI/Hepatic negative GI ROS, Neg liver ROS,,,  Endo/Other  diabetes    Renal/GU negative Renal ROS  negative genitourinary   Musculoskeletal negative musculoskeletal ROS (+)    Abdominal Normal abdominal exam  (+)   Peds negative pediatric ROS (+)  Hematology negative hematology ROS (+)   Anesthesia Other Findings Past Medical History: No date: Diabetes mellitus without complication (HCC) No date: Hypertension   Reproductive/Obstetrics                             Anesthesia Physical Anesthesia Plan  ASA: 2  Anesthesia Plan: General   Post-op Pain Management:    Induction: Intravenous  PONV Risk Score and Plan: 3 and Propofol infusion and TIVA  Airway Management Planned: Nasal Cannula and Natural Airway  Additional Equipment:   Intra-op Plan:   Post-operative Plan:   Informed Consent: I have reviewed the patients History and Physical, chart, labs and discussed the procedure including the risks, benefits and alternatives for the proposed anesthesia with the patient or authorized representative who has indicated his/her understanding and acceptance.     Dental advisory given  Plan  Discussed with: CRNA and Surgeon  Anesthesia Plan Comments:         Anesthesia Quick Evaluation

## 2022-08-04 NOTE — H&P (Signed)
Outpatient short stay form Pre-procedure 08/04/2022  Lesly Rubenstein, MD  Primary Physician: Maryland Pink, MD  Reason for visit:  Surveillance  History of present illness:    81 y/o lady with hypertension here for surveillance colonoscopy. Last colonoscopy 5 years ago with small adenoma. No blood thinners. No significant abdominal surgeries. No family history of GI malignancies.    Current Facility-Administered Medications:    0.9 %  sodium chloride infusion, , Intravenous, Continuous, Sharry Beining, Hilton Cork, MD  Medications Prior to Admission  Medication Sig Dispense Refill Last Dose   lisinopril (PRINIVIL,ZESTRIL) 10 MG tablet Take 10 mg by mouth 2 (two) times daily.   08/04/2022 at 0615   COVID-19 mRNA bivalent vaccine, Pfizer, (PFIZER COVID-19 VAC BIVALENT) injection Inject into the muscle. 0.3 mL 0      No Known Allergies   Past Medical History:  Diagnosis Date   Diabetes mellitus without complication ([place])    Hypertension     Review of systems:  Otherwise negative.    Physical Exam  Gen: Alert, oriented. Appears stated age.  HEENT: PERRLA. Lungs: No respiratory distress CV: RRR Abd: soft, benign, no masses Ext: No edema    Planned procedures: Proceed with colonoscopy. The patient understands the nature of the planned procedure, indications, risks, alternatives and potential complications including but not limited to bleeding, infection, perforation, damage to internal organs and possible oversedation/side effects from anesthesia. The patient agrees and gives consent to proceed.  Please refer to procedure notes for findings, recommendations and patient disposition/instructions.     Lesly Rubenstein, MD [facility]

## 2022-08-04 NOTE — Transfer of Care (Signed)
Immediate Anesthesia Transfer of Care Note  Patient: Sarah Morse  Procedure(s) Performed: COLONOSCOPY WITH PROPOFOL  Patient Location: PACU  Anesthesia Type:General  Level of Consciousness: awake and drowsy  Airway & Oxygen Therapy: Patient Spontanous Breathing  Post-op Assessment: Report given to RN and Post -op Vital signs reviewed and stable  Post vital signs: Reviewed and stable  Last Vitals:  Vitals Value Taken Time  BP 109/40 08/04/22 1032  Temp 35.6 C 08/04/22 1032  Pulse 86 08/04/22 1033  Resp 15 08/04/22 1033  SpO2 100 % 08/04/22 1033  Vitals shown include unvalidated device data.  Last Pain:  Vitals:   08/04/22 1032  TempSrc: Temporal  PainSc: 0-No pain         Complications: No notable events documented.

## 2022-08-04 NOTE — Op Note (Signed)
[facility] Patient Name: Sarah Morse Procedure Date: 08/04/2022 9:55 AM MRN: [id number] Account #: [id number] Date of Birth: 12-10-1940 Admit Type: Outpatient Age: 81 Room: [facility] ENDO ROOM 1 Gender: Female Note Status: Finalized Instrument Name: Peds Colonoscope [id number] Procedure:             Colonoscopy Indications:           Surveillance: Personal history of adenomatous polyps                         on last colonoscopy 5 years ago Providers:             Andrey Farmer MD, MD Referring MD:          Irven Easterly. Kary Kos, MD (Referring MD) Medicines:             Monitored Anesthesia Care Complications:         No immediate complications. Procedure:             Pre-Anesthesia Assessment:                        - Prior to the procedure, a History and Physical was                         performed, and patient medications and allergies were                         reviewed. The patient is competent. The risks and                         benefits of the procedure and the sedation options and                         risks were discussed with the patient. All questions                         were answered and informed consent was obtained.                         Patient identification and proposed procedure were                         verified by the physician, the nurse, the                         anesthesiologist, the anesthetist and the technician                         in the endoscopy suite. Mental Status Examination:                         alert and oriented. Airway Examination: normal                         oropharyngeal airway and neck mobility. Respiratory                         Examination: clear to auscultation. CV Examination:  normal. Prophylactic Antibiotics: The patient does not                         require prophylactic antibiotics. Prior                         Anticoagulants: The patient has taken no  anticoagulant                         or antiplatelet agents. ASA Grade Assessment: II - A                         patient with mild systemic disease. After reviewing                         the risks and benefits, the patient was deemed in                         satisfactory condition to undergo the procedure. The                         anesthesia plan was to use monitored anesthesia care                         (MAC). Immediately prior to administration of                         medications, the patient was re-assessed for adequacy                         to receive sedatives. The heart rate, respiratory                         rate, oxygen saturations, blood pressure, adequacy of                         pulmonary ventilation, and response to care were                         monitored throughout the procedure. The physical                         status of the patient was re-assessed after the                         procedure.                        After obtaining informed consent, the colonoscope was                         passed under direct vision. Throughout the procedure,                         the patient's blood pressure, pulse, and oxygen                         saturations were monitored continuously. The  Colonoscope was introduced through the anus and                         advanced to the the cecum, identified by appendiceal                         orifice and ileocecal valve. The colonoscopy was                         performed without difficulty. The patient tolerated                         the procedure well. The quality of the bowel                         preparation was good. The ileocecal valve, appendiceal                         orifice, and rectum were photographed. Findings:      The perianal and digital rectal examinations were normal.      A single small-mouthed diverticulum was found in the ascending colon.      Scattered  small-mouthed diverticula were found in the sigmoid colon.      Internal hemorrhoids were found during retroflexion and during perianal       exam. The hemorrhoids were Grade III (internal hemorrhoids that prolapse       but require manual reduction).      The exam was otherwise without abnormality on direct and retroflexion       views. Impression:            - Diverticulosis in the ascending colon.                        - Diverticulosis in the sigmoid colon.                        - Internal hemorrhoids.                        - The examination was otherwise normal on direct and                         retroflexion views.                        - No specimens collected. Recommendation:        - Discharge patient to home.                        - Resume previous diet.                        - Continue present medications.                        - Repeat colonoscopy is not recommended due to current                         age (83 years or older) for surveillance.                        -  Return to referring physician as previously                         scheduled. Procedure Code(s):     --- Professional ---                        N3614, Colorectal cancer screening; colonoscopy on                         individual at high risk Diagnosis Code(s):     --- Professional ---                        Z86.010, Personal history of colonic polyps                        K64.2, Third degree hemorrhoids                        K57.30, Diverticulosis of large intestine without                         perforation or abscess without bleeding CPT copyright 2022 American Medical Association. All rights reserved. The codes documented in this report are preliminary and upon coder review may  be revised to meet current compliance requirements. Andrey Farmer MD, MD 08/04/2022 10:33:25 AM Number of Addenda: 0 Note Initiated On: 08/04/2022 9:55 AM Estimated Blood Loss:  Estimated blood loss: none.       [facility]

## 2022-08-04 NOTE — Interval H&P Note (Signed)
History and Physical Interval Note:  08/04/2022 10:03 AM  Sarah Morse  has presented today for surgery, with the diagnosis of HX TA polyps.  The various methods of treatment have been discussed with the patient and family. After consideration of risks, benefits and other options for treatment, the patient has consented to  Procedure(s): COLONOSCOPY WITH PROPOFOL (N/A) as a surgical intervention.  The patient's history has been reviewed, patient examined, no change in status, stable for surgery.  I have reviewed the patient's chart and labs.  Questions were answered to the patient's satisfaction.     Lesly Rubenstein  Ok to proceed with colonoscopy

## 2022-08-05 ENCOUNTER — Encounter: Payer: Self-pay | Admitting: Gastroenterology

## 2022-08-06 NOTE — Anesthesia Postprocedure Evaluation (Signed)
Anesthesia Post Note  Patient: Sarah Morse  Procedure(s) Performed: COLONOSCOPY WITH PROPOFOL  Patient location during evaluation: Endoscopy Anesthesia Type: General Level of consciousness: awake and alert Pain management: pain level controlled Vital Signs Assessment: post-procedure vital signs reviewed and stable Respiratory status: spontaneous breathing, nonlabored ventilation, respiratory function stable and patient connected to nasal cannula oxygen Cardiovascular status: blood pressure returned to baseline and stable Postop Assessment: no apparent nausea or vomiting Anesthetic complications: no   No notable events documented.   Last Vitals:  Vitals:   08/04/22 1032 08/04/22 1052  BP: (!) 109/40   Pulse: 82   Resp: 15   Temp: (!) 35.6 C   SpO2: 100% 99%    Last Pain:  Vitals:   08/04/22 1052  TempSrc:   PainSc: 0-No pain                 Martha Clan

## 2022-11-03 ENCOUNTER — Ambulatory Visit: Payer: Medicare Other | Admitting: *Deleted

## 2022-11-13 ENCOUNTER — Encounter: Payer: Self-pay | Admitting: Skilled Nursing Facility1

## 2022-11-13 ENCOUNTER — Encounter: Payer: Medicare Other | Attending: Family Medicine | Admitting: Skilled Nursing Facility1

## 2022-11-13 DIAGNOSIS — Z713 Dietary counseling and surveillance: Secondary | ICD-10-CM | POA: Insufficient documentation

## 2022-11-13 DIAGNOSIS — E119 Type 2 diabetes mellitus without complications: Secondary | ICD-10-CM | POA: Insufficient documentation

## 2022-11-13 NOTE — Progress Notes (Addendum)
Appt conducted virtually: pt identified by name and DOB, pt agreeable to limitations of this visit type  DM medications: None  Other Dx: HTN  A1C 7.0  Pt states she takes a probiotic and a multivitamin.   Pt states she has had DM for about 20 years. Pt states her A1C has gone up due to care giving for her husband.  Pt states she likes accountability. Pt states she is 4'11'' and weighs about 115. Pt states she walks for about 45 minutes to an hour 2-3 days a week when her husband is at dialysis.  Pt states her blood sugars run about 80-90 fasting.  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  Pt states she checks her feet daily.  Pt states she visits her grandchildren which she loves. Pt states she neve gets blood sugars over 200. Pt is hypoglycemic aware and knows how to correct.  Pt states she stays moving around the house.  Pt states she stays very busy.  Pt states she does not add sugar to anything.   Goals: Honey instead of maple syrup Eat breakfast every day If you have already had a sweet and want another one you could try nuts or fruit instead DM is progressive so sometimes it is not something you are doing behvaiorally it could just be the disease progression  Diabetes Self-Management Education  Visit Type: Follow-up   11/13/2022  [name], identified by name and date of birth, is a 82 y.o. female with a diagnosis of Diabetes:  .   ASSESSMENT  There were no vitals taken for this visit. There is no height or weight on file to calculate BMI.   Diabetes Self-Management Education - 11/13/22 0940       Visit Information   Visit Type Follow-up      Health Coping   How would you rate your overall health? Good      Psychosocial Assessment   Patient Belief/Attitude about Diabetes Motivated to manage diabetes    What is the hardest part about your diabetes  right now, causing you the most concern, or is the most worrisome to you about your diabetes?   Making healty food and beverage choices    Self-management support Family    Patient Concerns Nutrition/Meal planning    Special Needs None    Learning Readiness Change in progress    How often do you need to have someone help you when you read instructions, pamphlets, or other written materials from your doctor or pharmacy? 1 - Never      Pre-Education Assessment   Patient understands the diabetes disease and treatment process. Comprehends key points    Patient understands incorporating nutritional management into lifestyle. Comprehends key points    Patient undertands incorporating physical activity into lifestyle. Comprehends key points    Patient understands using medications safely. Comprehends key points    Patient understands monitoring blood glucose, interpreting and using results Comprehends key points    Patient understands prevention, detection, and treatment of acute complications. Comprehends key points    Patient understands prevention, detection, and treatment of chronic complications. Compreheands key points    Patient understands how to develop strategies to address psychosocial issues. Comprehends key points    Patient understands how to develop strategies to promote health/change behavior. Comprehends key points      Complications   Last HgB A1C  per patient/outside source 7 %    Fasting Blood glucose range (mg/dL) 70-129    Number of hyperglycemic episodes ( >200mg /dL): Rare    Can you tell when your blood sugar is high? Yes    Have you had a dilated eye exam in the past 12 months? Yes    Have you had a dental exam in the past 12 months? Yes    Are you checking your feet? Yes    How many days per week are you checking your feet? 7      Dietary Intake   Breakfast coffee and fruit + greek  yogurt + maple syrup or hot cereal + fruit    Lunch salad: mixed green, chic peas,  dried cranberries, balsalmic and olive oil, french bread and salmon patty    Snack (afternoon) cookie or nuts    Dinner air fried chicken + rice + vegetables    Snack (evening) fruit    Beverage(s) coffee + creamer, juice mixed with seltzer water, water      Activity / Exercise   Activity / Exercise Type Light (walking / raking leaves)    How many days per week do you exercise? 30    How many minutes per day do you exercise? 3    Total minutes per week of exercise 90      Patient Education   Previous Diabetes Education Yes (please comment)    Healthy Eating Plate Method;Carbohydrate counting;Reviewed blood glucose goals for pre and post meals and how to evaluate the patients' food intake on their blood glucose level.;Information on hints to eating out and maintain blood glucose control.;Role of diet in the treatment of diabetes and the relationship between the three main macronutrients and blood glucose level;Food label reading, portion sizes and measuring food.    Being Active Role of exercise on diabetes management, blood pressure control and cardiac health.    Monitoring Taught/evaluated SMBG meter.;Daily foot exams;Yearly dilated eye exam;Interpreting lab values - A1C, lipid, urine microalbumina.    Acute complications Taught prevention, symptoms, and  treatment of hypoglycemia - the 15 rule.;Discussed and identified patients' prevention, symptoms, and treatment of hyperglycemia.    Diabetes Stress and Support Worked with patient to identify barriers to care and solutions;Role of stress on diabetes      Individualized Goals (developed by patient)   Nutrition Follow meal plan discussed;General guidelines for healthy choices and portions discussed    Medications Not Applicable    Monitoring  Test blood glucose pre and post meals as discussed    Problem Solving Eating Pattern;Addressing barriers to behavior change      Patient Self-Evaluation of Goals - Patient rates self as meeting  previously set goals (% of time)   Nutrition >75% (most of the time)    Physical Activity >75% (most of the time)    Medications >75% (most of the time)    Monitoring >75% (most of the time)    Problem Solving and behavior change strategies  >75% (most of the time)    Reducing Risk (treating acute and chronic complications) [id number] (most of the time)    Health Coping >75% (most of the time)      Post-Education Assessment   Patient understands the diabetes disease and treatment process. Demonstrates understanding / competency    Patient understands incorporating nutritional management into lifestyle. Demonstrates understanding / competency    Patient undertands incorporating physical activity into lifestyle. Demonstrates understanding / competency    Patient understands using medications  safely. Demonstrates understanding / competency    Patient understands monitoring blood glucose, interpreting and using results Demonstrates understanding / competency    Patient understands prevention, detection, and treatment of acute complications. Demonstrates understanding / competency    Patient understands how to develop strategies to address psychosocial issues. Demonstrates understanding / competency    Patient understands how to develop strategies to promote health/change behavior. Demonstrates understanding / competency      Outcomes   Expected Outcomes Demonstrated interest in learning. Expect positive outcomes    Future DMSE PRN    Program Status Completed      Subsequent Visit   Since your last visit have you continued or begun to take your medications as prescribed? Not on Medications    Since your last visit have you had your blood pressure checked? Yes    Is your most recent blood pressure lower, unchanged, or higher since your last visit? Unchanged    Since your last visit have you experienced any weight changes? No change    Since your last visit, are you checking your blood glucose at  least once a day? Yes             Individualized Plan for Diabetes Self-Management Training:   Learning Objective:  Patient will have a greater understanding of diabetes self-management. Patient education plan is to attend individual and/or group sessions per assessed needs and concerns.   Expected Outcomes:  Demonstrated interest in learning. Expect positive outcomes  Education material provided: ADA - How to Thrive: A Guide for Your Journey with Diabetes, Meal plan card, and No sodium seasonings  If problems or questions, patient to contact team via:  Phone and Email  Future DSME appointment: PRN

## 2022-11-17 ENCOUNTER — Ambulatory Visit: Payer: Medicare Other | Admitting: *Deleted

## 2022-11-27 ENCOUNTER — Ambulatory Visit: Payer: Medicare Other | Admitting: Skilled Nursing Facility1

## 2022-12-20 IMAGING — MG MM DIGITAL SCREENING BILAT W/ TOMO AND CAD
6 of 12 series · 6 of 36 positions shown · non-contrast
Comparison: Previous exam(s).

CLINICAL DATA: Screening.

EXAM:
DIGITAL SCREENING BILATERAL MAMMOGRAM WITH TOMOSYNTHESIS AND CAD
TECHNIQUE: Bilateral screening digital craniocaudal and mediolateral oblique
mammograms were obtained. Bilateral screening digital breast
tomosynthesis was performed. The images were evaluated with
computer-aided detection.

[L MLO synth-2D (1 of 2)]
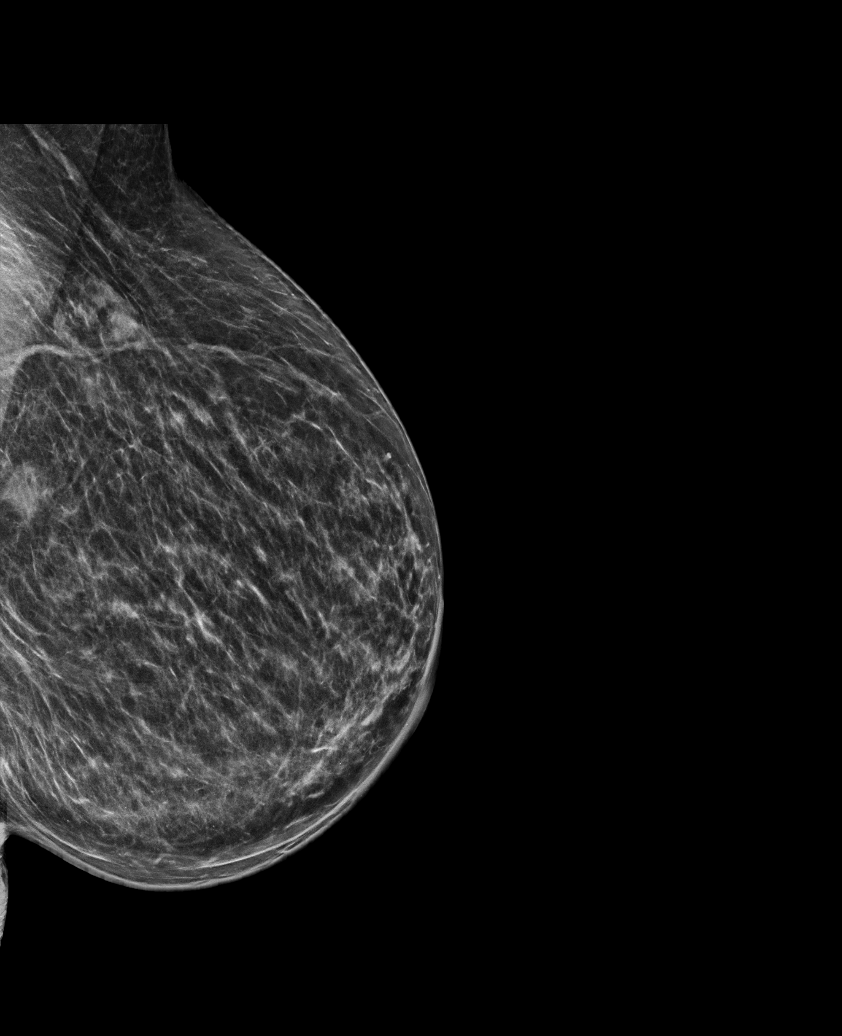

[R CC synth-2D]
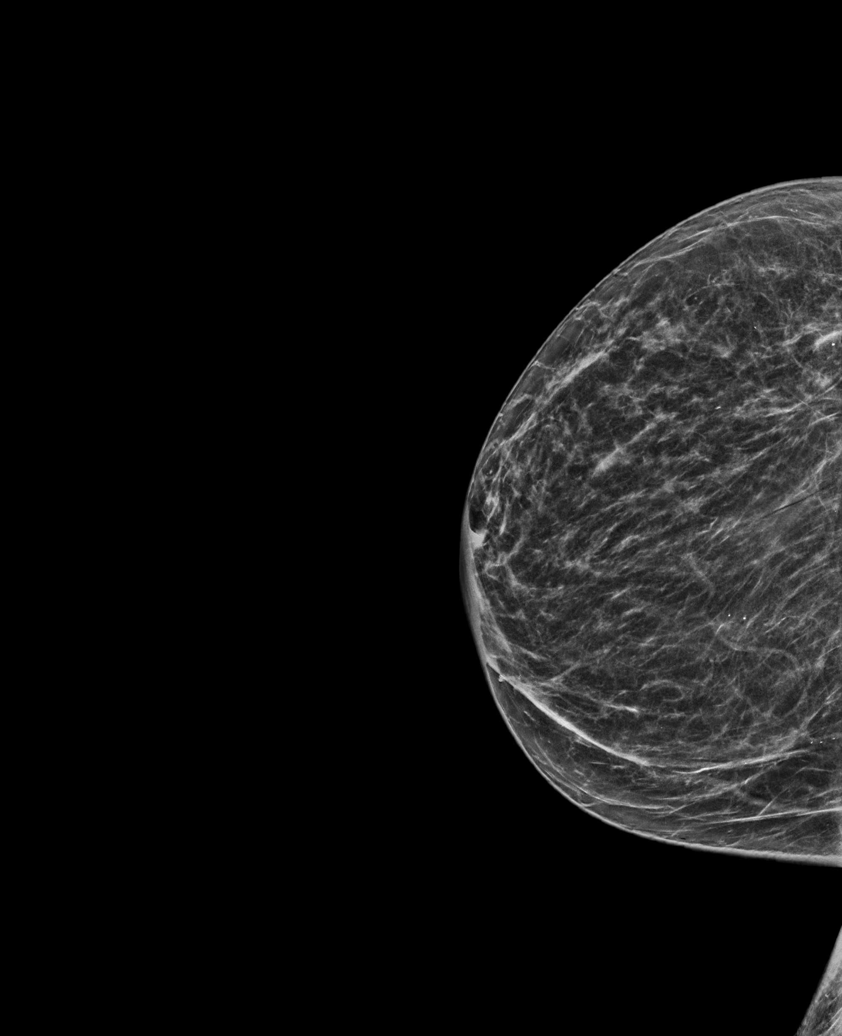

[L CC synth-2D]
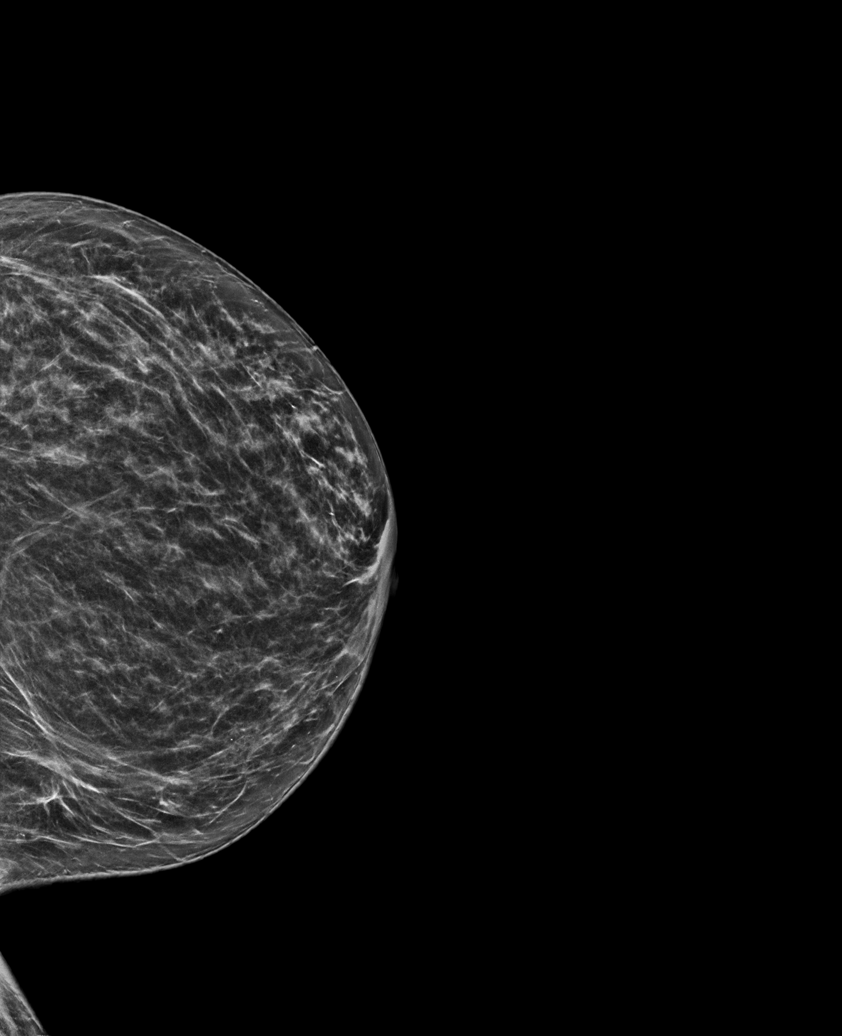

[R XCCL synth-2D]
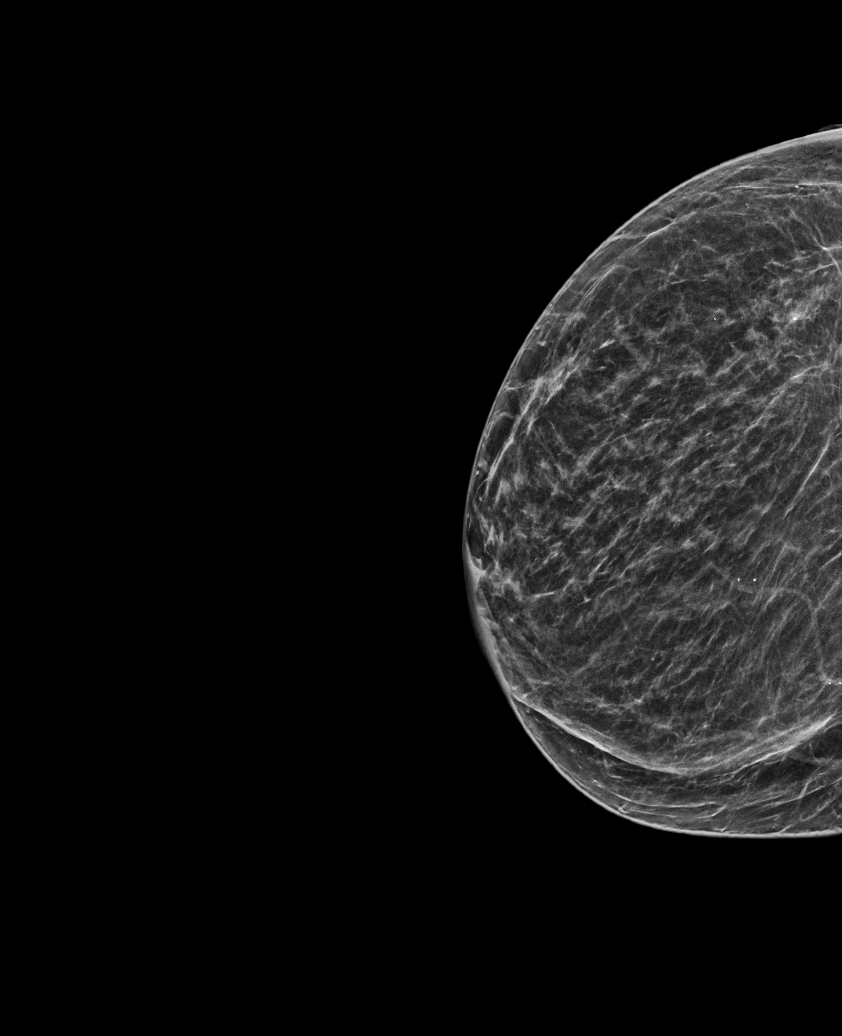

[R MLO synth-2D]
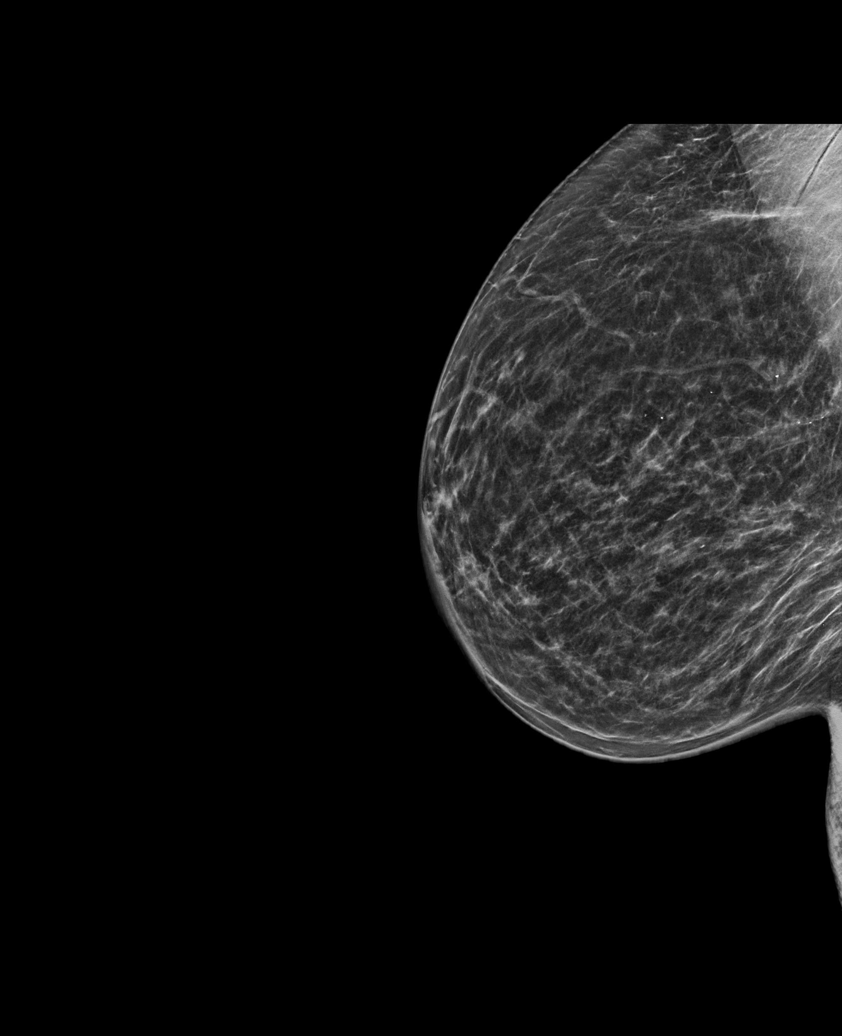

[L MLO synth-2D (2 of 2)]
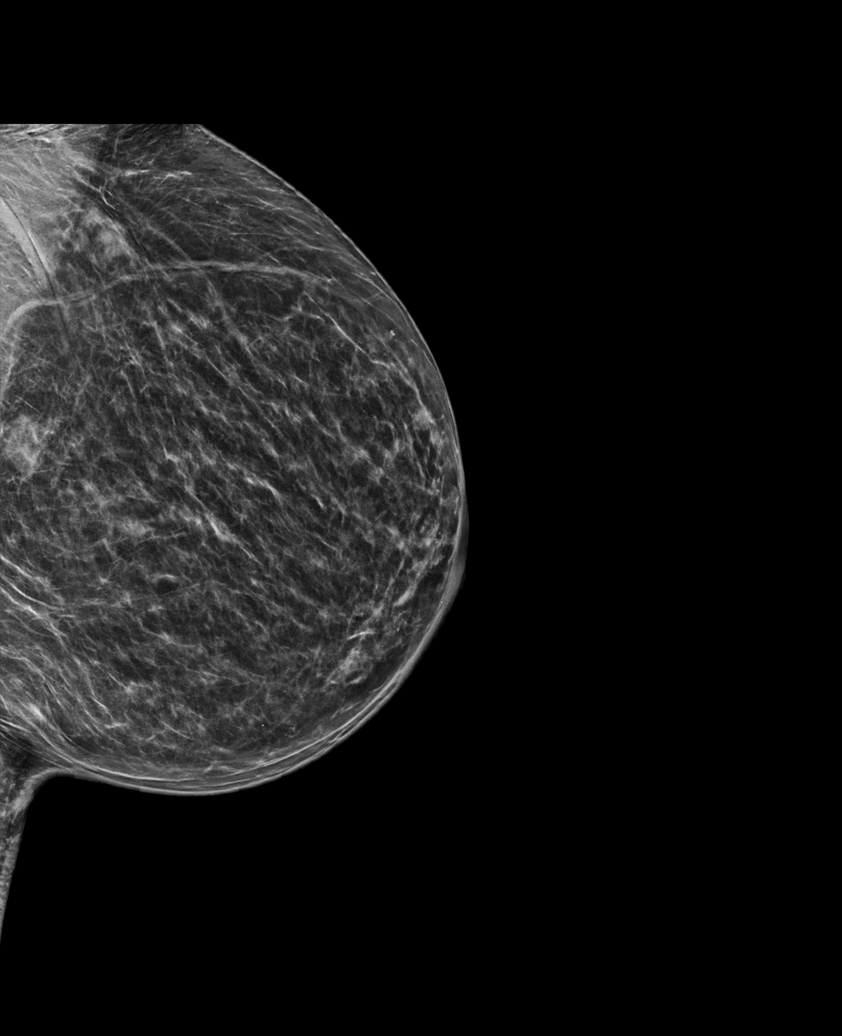

[6 of 36 positions shown; findings below may reference images not displayed]

ACR Breast Density Category b: There are scattered areas of
fibroglandular density.
FINDINGS: There are no findings suspicious for malignancy.
IMPRESSION: No mammographic evidence of malignancy. A result letter of this
screening mammogram will be mailed directly to the patient.

RECOMMENDATION:
Screening mammogram in one year. (Code:51-O-LD2)

BI-RADS CATEGORY  1: Negative.

## 2023-01-18 ENCOUNTER — Other Ambulatory Visit: Payer: Self-pay | Admitting: Family Medicine

## 2023-01-18 DIAGNOSIS — Z1231 Encounter for screening mammogram for malignant neoplasm of breast: Secondary | ICD-10-CM

## 2023-02-09 ENCOUNTER — Ambulatory Visit
Admission: RE | Admit: 2023-02-09 | Discharge: 2023-02-09 | Disposition: A | Discharge status: Home / Self Care | Payer: Medicare Other | Source: Ambulatory Visit | Attending: Family Medicine | Admitting: Family Medicine

## 2023-02-09 DIAGNOSIS — Z1231 Encounter for screening mammogram for malignant neoplasm of breast: Secondary | ICD-10-CM | POA: Insufficient documentation

## 2024-02-28 ENCOUNTER — Other Ambulatory Visit: Payer: Self-pay | Admitting: Family Medicine

## 2024-02-28 DIAGNOSIS — Z1231 Encounter for screening mammogram for malignant neoplasm of breast: Secondary | ICD-10-CM

## 2024-03-14 ENCOUNTER — Ambulatory Visit
Admission: RE | Admit: 2024-03-14 | Discharge: 2024-03-14 | Disposition: A | Discharge status: Home / Self Care | Source: Ambulatory Visit | Attending: Family Medicine | Admitting: Family Medicine

## 2024-03-14 DIAGNOSIS — Z1231 Encounter for screening mammogram for malignant neoplasm of breast: Secondary | ICD-10-CM | POA: Insufficient documentation

## 2024-07-07 ENCOUNTER — Other Ambulatory Visit: Payer: Self-pay | Admitting: Internal Medicine

## 2024-07-07 DIAGNOSIS — R079 Chest pain, unspecified: Secondary | ICD-10-CM

## 2024-07-18 ENCOUNTER — Ambulatory Visit
Admission: RE | Admit: 2024-07-18 | Discharge: 2024-07-18 | Disposition: A | Discharge status: Home / Self Care | Payer: Self-pay | Source: Ambulatory Visit | Attending: Internal Medicine | Admitting: Internal Medicine

## 2024-07-18 DIAGNOSIS — R079 Chest pain, unspecified: Secondary | ICD-10-CM | POA: Insufficient documentation

## 2024-08-05 ENCOUNTER — Emergency Department
Admission: EM | Admit: 2024-08-05 | Discharge: 2024-08-05 | Disposition: A | Discharge status: Home / Self Care | Attending: Emergency Medicine | Admitting: Emergency Medicine

## 2024-08-05 ENCOUNTER — Encounter: Payer: Self-pay | Admitting: Emergency Medicine

## 2024-08-05 ENCOUNTER — Other Ambulatory Visit: Payer: Self-pay

## 2024-08-05 ENCOUNTER — Emergency Department

## 2024-08-05 DIAGNOSIS — N281 Cyst of kidney, acquired: Secondary | ICD-10-CM

## 2024-08-05 DIAGNOSIS — M51369 Other intervertebral disc degeneration, lumbar region without mention of lumbar back pain or lower extremity pain: Secondary | ICD-10-CM

## 2024-08-05 DIAGNOSIS — K5900 Constipation, unspecified: Secondary | ICD-10-CM

## 2024-08-05 LAB — COMPREHENSIVE METABOLIC PANEL WITH GFR
ALT: 15 U/L (ref 0–44)
AST: 20 U/L (ref 15–41)
Albumin: 4.4 g/dL (ref 3.5–5.0)
Alkaline Phosphatase: 77 U/L (ref 38–126)
Anion gap: 11 (ref 5–15)
BUN: 14 mg/dL (ref 8–23)
CO2: 26 mmol/L (ref 22–32)
Calcium: 10 mg/dL (ref 8.9–10.3)
Chloride: 101 mmol/L (ref 98–111)
Creatinine, Ser: 0.74 mg/dL (ref 0.44–1.00)
GFR, Estimated: >60 mL/min (ref >60)
Glucose, Bld: 105 mg/dL — ABNORMAL HIGH (ref 70–99)
Potassium: 4.1 mmol/L (ref 3.5–5.1)
Sodium: 138 mmol/L (ref 135–145)
Total Bilirubin: 0.4 mg/dL (ref 0.0–1.2)
Total Protein: 8.1 g/dL (ref 6.5–8.1)

## 2024-08-05 LAB — URINALYSIS, ROUTINE W REFLEX MICROSCOPIC
Bacteria, UA: NONE SEEN
Bilirubin Urine: NEGATIVE
Glucose, UA: NEGATIVE mg/dL
Ketones, ur: 5 mg/dL — AB
Nitrite: NEGATIVE
Protein, ur: NEGATIVE mg/dL
Specific Gravity, Urine: 1.006 (ref 1.005–1.030)
pH: 5 (ref 5.0–8.0)

## 2024-08-05 LAB — CBC
HCT: 38.1 % (ref 36.0–46.0)
Hemoglobin: 12.3 g/dL (ref 12.0–15.0)
MCH: 26.9 pg (ref 26.0–34.0)
MCHC: 32.3 g/dL (ref 30.0–36.0)
MCV: 83.4 fL (ref 80.0–100.0)
Platelets: 254 K/uL (ref 150–400)
RBC: 4.57 MIL/uL (ref 3.87–5.11)
RDW: 13.8 % (ref 11.5–15.5)
WBC: 5.8 K/uL (ref 4.0–10.5)
nRBC: 0 % (ref 0.0–0.2)

## 2024-08-05 LAB — LIPASE, BLOOD: Lipase: 12 U/L (ref 11–51)

## 2024-08-05 MED ORDER — DOCUSATE SODIUM 100 MG PO CAPS: 100.0000 mg | ORAL_CAPSULE | Freq: Every day | ORAL | 2 refills | Status: AC | PRN

## 2024-08-05 MED ORDER — IOHEXOL 300 MG/ML  SOLN
100.0000 mL | Freq: Once | INTRAMUSCULAR | Status: AC | PRN
  Administered 2024-08-05: 100 mL via INTRAVENOUS

## 2024-08-05 NOTE — Discharge Instructions (Addendum)
 You were seen in the emergency department today for constipation.  We recommend that you use one or more of the following over-the-counter medications in the order described:   1)  Miralax (powder):  This medication works by drawing additional fluid into your intestines and helps to flush out your stool.  Mix the powder with water or juice according to label instructions.  Be sure to use the recommended amount of water or juice when you mix up the powder.  Plenty of fluids will help to prevent constipation. 2)  Colace (or Dulcolax) 100 mg:  This is a stool softener, and you may take it once or twice a day as needed.  You may also want to consider using glycerin suppositories, which you insert into your rectum.  You hold it in place and is dissolves and softens your stool and stimulates your bowels.  You could also consider using an enema, which is also available over the counter.  Drink plenty of fluids.  Please return to the Emergency Department immediately if you develop new or worsening symptoms that concern you, such as (but not limited to) fever > 101 degrees, severe abdominal pain, or persistent vomiting.   You also have a cyst in your left kidney.  It does not warrant any emergent workup in the emergency department at this time.  Please follow-up with your primary care provider following today's visit to discuss this finding and see if any further nonemergent labs or imaging is needed.

## 2024-08-05 NOTE — ED Notes (Signed)
 PT states she's been having pain in stomach, started this morning. Increases when moving. Pain is mid-abdomen . LBM 12/6.

## 2024-08-05 NOTE — ED Provider Notes (Cosign Needed Addendum)
 [facility] Provider Note    Event Date/Time   First MD Initiated Contact with Patient 08/05/24 1708     (approximate)   History   Abdominal Pain   HPI  Sarah Morse is a 83 y.o. female  with a past medical history of HTN, Diabetes presents to the emergency department with generalized abdominal pain for the past week.  She reports the pain is worse with movement when she is up walking and does not have this pain at any other time.  She reports no current pain at this time but wanted to come in for evaluation with concerns in case of something emergent. She also reports occasional mid lower back pain.  Last bowel movement was today.  She denies nausea, vomiting, diarrhea, urinary symptoms, chest pain, shortness of breath, saddle anesthesia, urinary retention, fever, history of cancer, IV drug use, abnormal vaginal discharge, hematuria, dysuria.  She does not take any medication at home for pain. No prior abdominal surgeries.   Physical Exam   Triage Vital Signs: ED Triage Vitals [08/05/24 1654]  Encounter Vitals Group     BP (!) 164/66     Girls Systolic BP Percentile      Girls Diastolic BP Percentile      Boys Systolic BP Percentile      Boys Diastolic BP Percentile      Pulse Rate 92     Resp 18     Temp 97.7 F (36.5 C)     Temp Source Oral     SpO2 99 %     Weight 112 lb (50.8 kg)     Height 4' 11 (1.499 m)     Head Circumference      Peak Flow      Pain Score 3     Pain Loc      Pain Education      Exclude from Growth Chart     Most recent vital signs: Vitals:   08/05/24 1807 08/05/24 1922  BP: (!) 143/50 (!) 139/51  Pulse: 73 76  Resp: 20 16  Temp: 97.7 F (36.5 C) 98.1 F (36.7 C)  SpO2: 99% 98%    General: Awake, in no acute distress. Appears stated age. Head: Normocephalic, atraumatic. Neck: Supple. CV: Good peripheral perfusion. PT pulses 2+ b/l. Respiratory:Normal respiratory effort.  No respiratory distress. CTAB. GI:  Soft, non-distended, non-tender. No rebound or guarding. Negative Murphy's sign. MSK: No midline cervical, thoracic, or lumbar tenderness. No spinal tenderness or paraspinal tenderness. Normal ROM with knee extension, dorsiflexion, and plantarflexion and 5/5 strength in bilateral lower extremities. No swelling or obvious deformities.  Straight leg raise negative b/l. Skin:Warm, dry, intact. No rashes, lesions, or ecchymosis. No cyanosis or pallor. Neurological: A&Ox4 to person, place, time, and situation. Sensation intact and equal to L4, L5, and S1.  No CVA tenderness bilaterally.  ED Results / Procedures / Treatments   Labs (all labs ordered are listed, but only abnormal results are displayed) Labs Reviewed  COMPREHENSIVE METABOLIC PANEL WITH GFR - Abnormal; Notable for the following components:      Result Value   Glucose, Bld 105 (*)    All other components within normal limits  URINALYSIS, ROUTINE W REFLEX MICROSCOPIC - Abnormal; Notable for the following components:   Color, Urine STRAW (*)    APPearance CLEAR (*)    Hgb urine dipstick SMALL (*)    Ketones, ur 5 (*)    Leukocytes,Ua SMALL (*)    All  other components within normal limits  LIPASE, BLOOD  CBC     EKG  Rate: 69 bpm Rhythm: NSR Axis: positive P Waves: present before every QRS PR Interval: 150 ms QRS Complex: 70 ms ST Segment: isometric QT interval: 392 ms T Waves: upright No evidence of STEMI, NSTEMI, LBBB.  CT Abd/Pelvis IMPRESSION: 1. Decompressed rectosigmoid colon with possible wall thickening. Recommend clinical correlation for possible rectosigmoid colitis. . 2. Moderate stool burden throughout the colon.  RADIOLOGY CT L Spine IMPRESSION: 1. No acute bony abnormality. 2. Degenerative disc and facet disease as described above.     PROCEDURES:  Critical Care performed: No   Procedures   MEDICATIONS ORDERED IN ED: Medications  iohexol  (OMNIPAQUE ) 300 MG/ML solution 100 mL (100 mLs  Intravenous Contrast Given 08/05/24 1825)     IMPRESSION / MDM / ASSESSMENT AND PLAN / ED COURSE  I reviewed the triage vital signs and the nursing notes.                              Differential diagnosis includes, but is not limited to, constipation, colitis, UTI, dehydration, SBO, DDD  Patient's presentation is most consistent with acute complicated illness / injury requiring diagnostic workup.  Patient is here with a few days of generalized abdominal and lumbar pain.  She is not currently in any pain that she reports, but she states her pain comes and goes especially when she is moving around at home and being more active.  She is afebrile and well-appearing.  Abdominal and back exam is unimpressive, but given her age I did order a CT abdomen pelvis and CT L-spine to evaluate her ongoing, intermittent pain.  EKG is normal sinus rhythm, no acute ischemic changes.  Lipase unremarkable.  CMP unremarkable.  CBC unremarkable, normal white blood cell count of 5.8.  UA with small leukocytes but without white blood cells.  Patient is not having any urinary symptoms at this time.  Discussed potential treatment for asymptomatic bacteriuria with supervising physician, Dr. Kevin Paduchowski, and he stated no antibiotics needed at this time.  CT L-spine with no acute findings, DDD is present at L4-L5 with some disc bulging at L4-L5 and L3-L4.  She has no red flag symptoms of back pain. No foot drop. No acute neurological abnormalities.  CT abdomen pelvis with 3.6 cm left parapelvic cyst.  No follow-up with nephrology needed at this time given no malignancy history.  CT also shows moderate stool burden in the colon.  Will treat for constipation.  Incidental findings discussed with the patient.  She already has MiraLAX at home.  Told her to continue use of MiraLAX and sent prescription for Colace as well.  Encouraged increased fluid and fiber intake.  Will have her follow-up with her primary care provider for her  DDD and left renal cyst findings found on CT scans today.  The patient may return to the emergency department for any new, worsening, or concerning symptoms. Patient was given the opportunity to ask questions; all questions were answered. Emergency department return precautions were discussed with the patient.  Patient is in agreement to the treatment plan.  Patient is stable for discharge.   FINAL CLINICAL IMPRESSION(S) / ED DIAGNOSES   Final diagnoses:  Constipation, unspecified constipation type  Parapelvic renal cyst  Degeneration of intervertebral disc of lumbar region, unspecified whether pain present     Rx / DC Orders   ED Discharge Orders  Ordered    docusate sodium  (COLACE) 100 MG capsule  Daily PRN        08/05/24 1903             Note:  This document was prepared using Dragon voice recognition software and may include unintentional dictation errors.     Sheron Salm, PA-C 08/05/24 2041    Sheron Salm, PA-C 08/05/24 2042

## 2024-08-05 NOTE — ED Notes (Signed)
 Patient transported to CT

## 2024-08-05 NOTE — ED Triage Notes (Signed)
 Patient arrives ambulatory by POV c/o mid abdominal pain since waking today. Denies any N/V/D. Pain worse with movement. No urinary symptoms.
